# Patient Record
Sex: Male | Born: 1965 | Race: Black or African American | Hispanic: No | Smoking: Current every day smoker
Health system: Southern US, Community
[De-identification: ages and names within clinical notes are randomized; demographics above are authoritative.]

---

## 2016-06-01 ENCOUNTER — Emergency Department (HOSPITAL_COMMUNITY): Payer: Self-pay

## 2016-06-01 ENCOUNTER — Inpatient Hospital Stay (HOSPITAL_COMMUNITY)
Admission: EM | Admit: 2016-06-01 | Discharge: 2016-06-04 | DRG: 378 | Disposition: A | Discharge status: Home / Self Care | Payer: Self-pay | Attending: Internal Medicine | Admitting: Internal Medicine

## 2016-06-01 ENCOUNTER — Encounter (HOSPITAL_COMMUNITY): Payer: Self-pay | Admitting: Emergency Medicine

## 2016-06-01 DIAGNOSIS — K922 Gastrointestinal hemorrhage, unspecified: Secondary | ICD-10-CM | POA: Diagnosis present

## 2016-06-01 DIAGNOSIS — K625 Hemorrhage of anus and rectum: Secondary | ICD-10-CM

## 2016-06-01 DIAGNOSIS — I959 Hypotension, unspecified: Secondary | ICD-10-CM | POA: Diagnosis present

## 2016-06-01 DIAGNOSIS — K254 Chronic or unspecified gastric ulcer with hemorrhage: Principal | ICD-10-CM | POA: Diagnosis present

## 2016-06-01 DIAGNOSIS — E538 Deficiency of other specified B group vitamins: Secondary | ICD-10-CM | POA: Diagnosis present

## 2016-06-01 DIAGNOSIS — K920 Hematemesis: Secondary | ICD-10-CM

## 2016-06-01 DIAGNOSIS — Z7141 Alcohol abuse counseling and surveillance of alcoholic: Secondary | ICD-10-CM

## 2016-06-01 DIAGNOSIS — F1090 Alcohol use, unspecified, uncomplicated: Secondary | ICD-10-CM

## 2016-06-01 DIAGNOSIS — R739 Hyperglycemia, unspecified: Secondary | ICD-10-CM

## 2016-06-01 DIAGNOSIS — K219 Gastro-esophageal reflux disease without esophagitis: Secondary | ICD-10-CM

## 2016-06-01 DIAGNOSIS — F17210 Nicotine dependence, cigarettes, uncomplicated: Secondary | ICD-10-CM | POA: Diagnosis present

## 2016-06-01 DIAGNOSIS — D62 Acute posthemorrhagic anemia: Secondary | ICD-10-CM

## 2016-06-01 DIAGNOSIS — R000 Tachycardia, unspecified: Secondary | ICD-10-CM | POA: Diagnosis present

## 2016-06-01 DIAGNOSIS — K2900 Acute gastritis without bleeding: Secondary | ICD-10-CM | POA: Diagnosis present

## 2016-06-01 DIAGNOSIS — Z716 Tobacco abuse counseling: Secondary | ICD-10-CM

## 2016-06-01 DIAGNOSIS — E876 Hypokalemia: Secondary | ICD-10-CM

## 2016-06-01 DIAGNOSIS — F1010 Alcohol abuse, uncomplicated: Secondary | ICD-10-CM | POA: Diagnosis present

## 2016-06-01 DIAGNOSIS — IMO0002 Reserved for concepts with insufficient information to code with codable children: Secondary | ICD-10-CM

## 2016-06-01 LAB — CBC
HCT: 27 % — ABNORMAL LOW (ref 39.0–52.0)
Hemoglobin: 9.8 g/dL — ABNORMAL LOW (ref 13.0–17.0)
MCH: 35.6 pg — AB (ref 26.0–34.0)
MCHC: 36.3 g/dL — AB (ref 30.0–36.0)
MCV: 98.2 fL (ref 78.0–100.0)
PLATELETS: 306 10*3/uL (ref 150–400)
RBC: 2.75 MIL/uL — ABNORMAL LOW (ref 4.22–5.81)
RDW: 12.1 % (ref 11.5–15.5)
WBC: 8.7 10*3/uL (ref 4.0–10.5)

## 2016-06-01 LAB — COMPREHENSIVE METABOLIC PANEL
ALBUMIN: 3.1 g/dL — AB (ref 3.5–5.0)
ALT: 9 U/L — AB (ref 17–63)
AST: 18 U/L (ref 15–41)
Alkaline Phosphatase: 51 U/L (ref 38–126)
Anion gap: 10 (ref 5–15)
BUN: 33 mg/dL — AB (ref 6–20)
CHLORIDE: 101 mmol/L (ref 101–111)
CO2: 26 mmol/L (ref 22–32)
CREATININE: 1.1 mg/dL (ref 0.61–1.24)
Calcium: 8.5 mg/dL — ABNORMAL LOW (ref 8.9–10.3)
GFR calc Af Amer: >60 mL/min (ref >60)
GLUCOSE: 210 mg/dL — AB (ref 65–99)
Potassium: 3.7 mmol/L (ref 3.5–5.1)
Sodium: 137 mmol/L (ref 135–145)
Total Bilirubin: 0.5 mg/dL (ref 0.3–1.2)
Total Protein: 5.8 g/dL — ABNORMAL LOW (ref 6.5–8.1)

## 2016-06-01 LAB — PREPARE RBC (CROSSMATCH)

## 2016-06-01 LAB — POC OCCULT BLOOD, ED: Fecal Occult Bld: POSITIVE — AB

## 2016-06-01 MED ORDER — SODIUM CHLORIDE 0.9 % IV SOLN
8.0000 mg/h | INTRAVENOUS | Status: DC
  Administered 2016-06-02 – 2016-06-03 (×3): 8 mg/h via INTRAVENOUS
  Filled 2016-06-01 (×8): qty 80

## 2016-06-01 MED ORDER — SODIUM CHLORIDE 0.9 % IV SOLN
80.0000 mg | Freq: Once | INTRAVENOUS | Status: AC
  Administered 2016-06-01: 80 mg via INTRAVENOUS
  Filled 2016-06-01: qty 80

## 2016-06-01 MED ORDER — OCTREOTIDE LOAD VIA INFUSION
50.0000 ug | Freq: Once | INTRAVENOUS | Status: AC
  Administered 2016-06-02: 50 ug via INTRAVENOUS
  Filled 2016-06-01: qty 25

## 2016-06-01 MED ORDER — PANTOPRAZOLE SODIUM 40 MG IV SOLR: 40.0000 mg | Freq: Two times a day (BID) | INTRAVENOUS | Status: DC

## 2016-06-01 MED ORDER — SODIUM CHLORIDE 0.9 % IV SOLN
50.0000 ug/h | INTRAVENOUS | Status: DC
  Administered 2016-06-02 – 2016-06-03 (×4): 50 ug/h via INTRAVENOUS
  Filled 2016-06-01 (×7): qty 1

## 2016-06-01 MED ORDER — SODIUM CHLORIDE 0.9 % IV SOLN
10.0000 mL/h | Freq: Once | INTRAVENOUS | Status: AC
  Administered 2016-06-02: 10 mL/h via INTRAVENOUS

## 2016-06-01 MED ORDER — SODIUM CHLORIDE 0.9 % IV BOLUS (SEPSIS)
1000.0000 mL | Freq: Once | INTRAVENOUS | Status: AC
  Administered 2016-06-02: 1000 mL via INTRAVENOUS

## 2016-06-01 MED ORDER — SODIUM CHLORIDE 0.9 % IV BOLUS (SEPSIS)
1000.0000 mL | Freq: Once | INTRAVENOUS | Status: AC
  Administered 2016-06-01: 1000 mL via INTRAVENOUS

## 2016-06-01 MED ORDER — ONDANSETRON HCL 4 MG/2ML IJ SOLN
4.0000 mg | Freq: Once | INTRAMUSCULAR | Status: AC
  Administered 2016-06-01: 4 mg via INTRAVENOUS
  Filled 2016-06-01: qty 2

## 2016-06-01 NOTE — ED Provider Notes (Signed)
[facility]-EMERGENCY DEPT Provider Note   CSN: [id number] Arrival date & time: 06/01/16  2150   By signing my name below, I, Clarisse Gouge, attest that this documentation has been prepared under the direction and in the presence of Martie Muhlbauer, MD. Electronically signed, Clarisse Gouge, ED Scribe. 06/01/16. 11:47 PM.   History   Chief Complaint Chief Complaint  Patient presents with  . Hematemesis  . Blood In Stools   The history is provided by the patient and medical records. No language interpreter was used.  Emesis   This is a new problem. The current episode started 1 to 2 hours ago. The problem occurs 2 to 4 times per day. The problem has not changed since onset.The emesis has an appearance of bright red blood. There has been no fever. Associated symptoms include abdominal pain and diarrhea. Pertinent negatives include no arthralgias, no fever, no headaches and no myalgias. Risk factors: alcohol abuse.    Derek Fowler is a 51 y.o. male with no pertinent PMHx reported or on file, transported via EMS to the Emergency Department with concern for hematemesis today. He notes associated 10/10 aching epigastric pain per triage, bloody stool and states both emesis and stool had bright red blood. No other modifying factors noted. Pt seen [facility] on Monday for vomiting without blood or diarrhea, imaging and blood work done with normal results, diagnosed with stomache flu and sent home without medications. Pt advised to F/U with PCP at this time. Pt not on any regular medications at home, no chronic disorders reported. No h/o similar symptoms noted. Pr states he smokes 1/2 pack of cigarettes per day and last drank alcohol 3-4 weeks ago. No other complaints at this time.      History reviewed. No pertinent past medical history.  There are no active problems to display for this patient.   History reviewed. No pertinent surgical history.     Home Medications    Prior to Admission  medications   Medication Sig Start Date End Date Taking? Authorizing Provider  ibuprofen (ADVIL,MOTRIN) 200 MG tablet Take 400 mg by mouth every 6 (six) hours as needed for moderate pain.   Yes Historical Provider, MD    Family History No family history on file.  Social History Social History  Substance Use Topics  . Smoking status: Current Every Day Smoker    Packs/day: 0.50    Types: Cigarettes  . Smokeless tobacco: Never Used  . Alcohol use No     Allergies   Patient has no known allergies.   Review of Systems Review of Systems  Constitutional: Negative for fever.  Respiratory: Negative for shortness of breath.   Cardiovascular: Negative for chest pain.  Gastrointestinal: Positive for abdominal pain, blood in stool, diarrhea, nausea and vomiting (bloody).  Genitourinary: Negative for dysuria.  Musculoskeletal: Negative for arthralgias and myalgias.  Neurological: Negative for headaches.  All other systems reviewed and are negative.    Physical Exam Updated Vital Signs BP 93/75 (BP Location: Left Arm)   Pulse (!) 121   Temp 97.8 F (36.6 C) (Rectal)   Resp (!) 23   SpO2 100%   Physical Exam  Constitutional: He is oriented to person, place, and time. He appears well-developed and well-nourished.  HENT:  Head: Normocephalic and atraumatic.  Mouth/Throat: Oropharynx is clear and moist. No oropharyngeal exudate.  Blood crusted around the mouth  Eyes: EOM are normal. Pupils are equal, round, and reactive to light.  Neck: Normal range of motion.  Neck supple. No JVD present.  Cardiovascular: Regular rhythm, normal heart sounds and intact distal pulses.  Tachycardia present.   Pulmonary/Chest: Effort normal and breath sounds normal. No stridor. He has no wheezes. He has no rales.  No stridor  Abdominal: Soft. Bowel sounds are normal. He exhibits no mass. There is no tenderness. There is no rebound and no guarding.  Genitourinary: Rectal exam shows guaiac positive  stool.  Musculoskeletal: Normal range of motion. He exhibits no edema.  No bruising noted  Neurological: He is alert and oriented to person, place, and time. He displays normal reflexes.  Skin: Skin is warm and dry. Capillary refill takes less than 2 seconds. He is not diaphoretic.  Psychiatric: He has a normal mood and affect.  Nursing note and vitals reviewed.  No stridor bruise intact distal intact dtr soft abdomen PERRL moist muc no exud  ED Treatments / Results   Vitals:   06/02/16 0000 06/02/16 0015  BP: 102/65 (!) 101/53  Pulse: 78 90  Resp: 13 (!) 23  Temp:      DIAGNOSTIC STUDIES: Oxygen Saturation is 100% on RA, NL by my interpretation.    COORDINATION OF CARE: 11:23 PM-Discussed next steps with pt. Pt verbalized understanding and is agreeable with the plan. Will order labs and medications.   Labs (all labs ordered are listed, but only abnormal results are displayed)  Results for orders placed or performed during the hospital encounter of 06/01/16  Comprehensive metabolic panel  Result Value Ref Range   Sodium 137 135 - 145 mmol/L   Potassium 3.7 3.5 - 5.1 mmol/L   Chloride 101 101 - 111 mmol/L   CO2 26 22 - 32 mmol/L   Glucose, Bld 210 (H) 65 - 99 mg/dL   BUN 33 (H) 6 - 20 mg/dL   Creatinine, Ser 1.61 0.61 - 1.24 mg/dL   Calcium 8.5 (L) 8.9 - 10.3 mg/dL   Total Protein 5.8 (L) 6.5 - 8.1 g/dL   Albumin 3.1 (L) 3.5 - 5.0 g/dL   AST 18 15 - 41 U/L   ALT 9 (L) 17 - 63 U/L   Alkaline Phosphatase 51 38 - 126 U/L   Total Bilirubin 0.5 0.3 - 1.2 mg/dL   GFR calc non Af Amer >60 >60 mL/min   GFR calc Af Amer >60 >60 mL/min   Anion gap 10 5 - 15  CBC  Result Value Ref Range   WBC 8.7 4.0 - 10.5 K/uL   RBC 2.75 (L) 4.22 - 5.81 MIL/uL   Hemoglobin 9.8 (L) 13.0 - 17.0 g/dL   HCT 09.6 (L) 04.5 - 40.9 %   MCV 98.2 78.0 - 100.0 fL   MCH 35.6 (H) 26.0 - 34.0 pg   MCHC 36.3 (H) 30.0 - 36.0 g/dL   RDW 81.1 91.4 - 78.2 %   Platelets 306 150 - 400 K/uL  POC occult  blood, ED  Result Value Ref Range   Fecal Occult Bld POSITIVE (A) NEGATIVE  Type and screen [facility]  Result Value Ref Range   ABO/RH(D) O POS    Antibody Screen NEG    Sample Expiration 06/04/2016    Unit Number [id number]    Blood Component Type RED CELLS,LR    Unit division 00    Status of Unit ISSUED    Transfusion Status OK TO TRANSFUSE    Crossmatch Result Compatible    Unit Number [id number]    Blood Component Type RED CELLS,LR  Unit division 00    Status of Unit ALLOCATED    Transfusion Status OK TO TRANSFUSE    Crossmatch Result Compatible    Unit Number [id number]    Blood Component Type RED CELLS,LR    Unit division 00    Status of Unit ALLOCATED    Transfusion Status OK TO TRANSFUSE    Crossmatch Result Compatible    Unit Number [id number]    Blood Component Type RED CELLS,LR    Unit division 00    Status of Unit ALLOCATED    Transfusion Status OK TO TRANSFUSE    Crossmatch Result Compatible   ABO/Rh  Result Value Ref Range   ABO/RH(D) O POS   Prepare RBC  Result Value Ref Range   Order Confirmation ORDER PROCESSED BY BLOOD BANK   BPAM RBC  Result Value Ref Range   ISSUE DATE / TIME [id number]    Blood Product Unit Number [id number]    PRODUCT CODE [id number]    Unit Type and Rh 5100    Blood Product Expiration Date 272536644034    Blood Product Unit Number [id number]    Unit Type and Rh 5100    Blood Product Expiration Date 332951884166    Blood Product Unit Number [id number]    Unit Type and Rh 5100    Blood Product Expiration Date 557322025427    Blood Product Unit Number [id number]    Unit Type and Rh 5100    Blood Product Expiration Date 616073710626    No results found.   Procedures Procedures (including critical care time)  Medications Ordered in ED Medications  pantoprazole (PROTONIX) 80 mg in sodium chloride 0.9 % 250 mL (0.32 mg/mL) infusion (8 mg/hr Intravenous New Bag/Given 06/02/16  0006)  pantoprazole (PROTONIX) injection 40 mg (not administered)  0.9 %  sodium chloride infusion (not administered)  octreotide (SANDOSTATIN) 2 mcg/mL load via infusion 50 mcg (not administered)    And  octreotide (SANDOSTATIN) 500 mcg in sodium chloride 0.9 % 250 mL (2 mcg/mL) infusion (not administered)  sodium chloride 0.9 % bolus 1,000 mL (0 mLs Intravenous Stopped 06/02/16 0005)  pantoprazole (PROTONIX) 80 mg in sodium chloride 0.9 % 100 mL IVPB (0 mg Intravenous Stopped 06/02/16 0005)  ondansetron (ZOFRAN) injection 4 mg (4 mg Intravenous Given 06/01/16 2330)  sodium chloride 0.9 % bolus 1,000 mL (1,000 mLs Intravenous New Bag/Given 06/02/16 0008)   Blood prepared for transfusion.  Initial Impression / Assessment and Plan / ED Course  I have reviewed the triage vital signs and the nursing notes.  Pertinent labs & imaging results that were available during my care of the patient were reviewed by me and considered in my medical decision making (see chart for details).     MDM Reviewed: previous chart, nursing note and vitals Reviewed previous: labs and CT scan Interpretation: labs (HB 9.8 which is anemic and has elevated glucose and BUN) Total time providing critical care: 75-105 minutes. This excludes time spent performing separately reportable procedures and services. Consults: critical care and gastrointestinal   11:53 PM Pt discussed with Dr. Arsenio Loader of critical care, a member of the team will see the patient.  12:07 AM Pt discussed with Dr. Dulce Sellar of GI who has been consulted.  CRITICAL CARE Performed by: Jasmine Awe Total critical care time: 90 minutes Critical care time was exclusive of separately billable procedures and treating other patients. Critical care was necessary to treat or prevent imminent or life-threatening deterioration. Critical care was time spent personally by me  on the following activities: development of treatment plan with patient and/or  surrogate as well as nursing, discussions with consultants, evaluation of patient's response to treatment, examination of patient, obtaining history from patient or surrogate, ordering and performing treatments and interventions, ordering and review of laboratory studies, ordering and review of radiographic studies, pulse oximetry and re-evaluation of patient's condition.   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     I personally performed the services described in this documentation, which was scribed in my presence. The recorded information has been reviewed and is accurate.       Cy Blamer, MD 06/02/16 [phone]

## 2016-06-01 NOTE — ED Triage Notes (Signed)
Pt comes from home with complaints of vomiting blood and blood in his stools since this evening.  Endorses epigastric pain in the past 2 weeks. A&O x4. Ambulatory with assistance.  Vitals 115/91 BP, 120 HR, 16 RR in route. CBG 113.

## 2016-06-01 NOTE — ED Notes (Signed)
Bed: ZO10 Expected date:  Expected time:  Means of arrival:  Comments: 51 yo M  GI bleed

## 2016-06-02 ENCOUNTER — Emergency Department (HOSPITAL_COMMUNITY): Payer: Self-pay

## 2016-06-02 ENCOUNTER — Encounter (HOSPITAL_COMMUNITY): Payer: Self-pay | Admitting: Emergency Medicine

## 2016-06-02 ENCOUNTER — Encounter (HOSPITAL_COMMUNITY)
Admission: EM | Disposition: A | Discharge status: Home / Self Care | Payer: Self-pay | Source: Home / Self Care | Attending: Internal Medicine

## 2016-06-02 DIAGNOSIS — K922 Gastrointestinal hemorrhage, unspecified: Secondary | ICD-10-CM

## 2016-06-02 HISTORY — PX: ESOPHAGOGASTRODUODENOSCOPY: SHX5428

## 2016-06-02 LAB — BASIC METABOLIC PANEL
ANION GAP: 7 (ref 5–15)
BUN: 28 mg/dL — ABNORMAL HIGH (ref 6–20)
CALCIUM: 7.4 mg/dL — AB (ref 8.9–10.3)
CO2: 24 mmol/L (ref 22–32)
CREATININE: 0.76 mg/dL (ref 0.61–1.24)
Chloride: 107 mmol/L (ref 101–111)
Glucose, Bld: 119 mg/dL — ABNORMAL HIGH (ref 65–99)
Potassium: 4.5 mmol/L (ref 3.5–5.1)
SODIUM: 138 mmol/L (ref 135–145)

## 2016-06-02 LAB — CBC
HCT: 26.7 % — ABNORMAL LOW (ref 39.0–52.0)
HCT: 24 % — ABNORMAL LOW (ref 39.0–52.0)
HEMATOCRIT: 25.2 % — AB (ref 39.0–52.0)
HEMATOCRIT: 25.1 % — AB (ref 39.0–52.0)
HEMOGLOBIN: 8.4 g/dL — AB (ref 13.0–17.0)
Hemoglobin: 8.8 g/dL — ABNORMAL LOW (ref 13.0–17.0)
Hemoglobin: 9.5 g/dL — ABNORMAL LOW (ref 13.0–17.0)
Hemoglobin: 8.7 g/dL — ABNORMAL LOW (ref 13.0–17.0)
MCH: 32.8 pg (ref 26.0–34.0)
MCH: 33.9 pg (ref 26.0–34.0)
MCH: 32.5 pg (ref 26.0–34.0)
MCH: 32.9 pg (ref 26.0–34.0)
MCHC: 34.9 g/dL (ref 30.0–36.0)
MCHC: 35.6 g/dL (ref 30.0–36.0)
MCHC: 34.7 g/dL (ref 30.0–36.0)
MCHC: 35 g/dL (ref 30.0–36.0)
MCV: 94 fL (ref 78.0–100.0)
MCV: 95.4 fL (ref 78.0–100.0)
MCV: 93.7 fL (ref 78.0–100.0)
MCV: 94.1 fL (ref 78.0–100.0)
PLATELETS: 182 10*3/uL (ref 150–400)
PLATELETS: 169 10*3/uL (ref 150–400)
Platelets: 207 10*3/uL (ref 150–400)
Platelets: 167 10*3/uL (ref 150–400)
RBC: 2.68 MIL/uL — ABNORMAL LOW (ref 4.22–5.81)
RBC: 2.8 MIL/uL — AB (ref 4.22–5.81)
RBC: 2.68 MIL/uL — ABNORMAL LOW (ref 4.22–5.81)
RBC: 2.55 MIL/uL — AB (ref 4.22–5.81)
RDW: 12.4 % (ref 11.5–15.5)
RDW: 13.4 % (ref 11.5–15.5)
RDW: 13.7 % (ref 11.5–15.5)
RDW: 13.8 % (ref 11.5–15.5)
WBC: 8.7 10*3/uL (ref 4.0–10.5)
WBC: 8 10*3/uL (ref 4.0–10.5)
WBC: 6.7 10*3/uL (ref 4.0–10.5)
WBC: 7 10*3/uL (ref 4.0–10.5)

## 2016-06-02 LAB — ABO/RH: ABO/RH(D): O POS

## 2016-06-02 LAB — PROTIME-INR
INR: 1.07
PROTHROMBIN TIME: 14 s (ref 11.4–15.2)

## 2016-06-02 LAB — MRSA PCR SCREENING: MRSA BY PCR: NEGATIVE

## 2016-06-02 LAB — PHOSPHORUS: PHOSPHORUS: 3.5 mg/dL (ref 2.5–4.6)

## 2016-06-02 LAB — MAGNESIUM: MAGNESIUM: 1.8 mg/dL (ref 1.7–2.4)

## 2016-06-02 SURGERY — EGD (ESOPHAGOGASTRODUODENOSCOPY)
Anesthesia: Moderate Sedation

## 2016-06-02 MED ORDER — FENTANYL CITRATE (PF) 100 MCG/2ML IJ SOLN
INTRAMUSCULAR | Status: AC
  Filled 2016-06-02: qty 4

## 2016-06-02 MED ORDER — MIDAZOLAM HCL 5 MG/ML IJ SOLN
INTRAMUSCULAR | Status: AC
  Filled 2016-06-02: qty 2

## 2016-06-02 MED ORDER — SODIUM CHLORIDE 0.9 % IV BOLUS (SEPSIS)
1000.0000 mL | Freq: Once | INTRAVENOUS | Status: AC
  Administered 2016-06-02: 1000 mL via INTRAVENOUS

## 2016-06-02 MED ORDER — SODIUM CHLORIDE 0.9 % IV SOLN
INTRAVENOUS | Status: DC
  Administered 2016-06-02 – 2016-06-04 (×4): via INTRAVENOUS

## 2016-06-02 MED ORDER — FENTANYL CITRATE (PF) 100 MCG/2ML IJ SOLN
INTRAMUSCULAR | Status: DC | PRN
  Administered 2016-06-02 (×3): 25 ug via INTRAVENOUS

## 2016-06-02 MED ORDER — MIDAZOLAM HCL 10 MG/2ML IJ SOLN
INTRAMUSCULAR | Status: DC | PRN
  Administered 2016-06-02 (×3): 2 mg via INTRAVENOUS

## 2016-06-02 MED ORDER — BUTAMBEN-TETRACAINE-BENZOCAINE 2-2-14 % EX AERO
INHALATION_SPRAY | CUTANEOUS | Status: DC | PRN
  Administered 2016-06-02: 2 via TOPICAL

## 2016-06-02 NOTE — Progress Notes (Signed)
BP improved with NS, pt hemodynamically stable.  EGD results noted > deep cratered antral ulcer with adherent clot s/p biopsy.  If re-bleeding, would need IR for embolization vs surgery.  GI rec's for continued NPO & assess H.Pylori serology.    PCCM will transfer patient to SDU status and TRH for am 4/21.  Canary Brim, NP-C [facility] Pgr: [phone] or if no answer [phone] 06/02/2016, 2:10 PM

## 2016-06-02 NOTE — Interval H&P Note (Signed)
History and Physical Interval Note:  06/02/2016 11:46 AM  Derek Fowler  has presented today for surgery, with the diagnosis of GI bleed  The various methods of treatment have been discussed with the patient and family. After consideration of risks, benefits and other options for treatment, the patient has consented to  Procedure(s): ESOPHAGOGASTRODUODENOSCOPY (EGD) (N/A) as a surgical intervention .  The patient's history has been reviewed, patient examined, no change in status, stable for surgery.  I have reviewed the patient's chart and labs.  Questions were answered to the patient's satisfaction.     [name] C.

## 2016-06-02 NOTE — Op Note (Signed)
[facility] Patient Name: Derek Fowler Procedure Date: 06/02/2016 MRN: [id number] Attending MD: Shirley Friar , MD Date of Birth: August 01, 1965 CSN: [id number] Age: 51 Admit Type: Inpatient Procedure:                Upper GI endoscopy Indications:              Suspected upper gastrointestinal bleeding,                            Hematemesis, Melena Providers:                Shirley Friar, MD, Cathlean Marseilles, RN, Kandice Robinsons, Technician Referring MD:              Medicines:                Fentanyl 75 micrograms IV, Midazolam 6 mg IV,                            Cetacaine spray Complications:            No immediate complications. Estimated Blood Loss:     Estimated blood loss was minimal. Procedure:                Pre-Anesthesia Assessment:                           - Prior to the procedure, a History and Physical                            was performed, and patient medications and                            allergies were reviewed. The patient's tolerance of                            previous anesthesia was also reviewed. The risks                            and benefits of the procedure and the sedation                            options and risks were discussed with the patient.                            All questions were answered, and informed consent                            was obtained. Prior Anticoagulants: The patient has                            taken no previous anticoagulant or antiplatelet                            agents. ASA Grade  Assessment: III - A patient with                            severe systemic disease. After reviewing the risks                            and benefits, the patient was deemed in                            satisfactory condition to undergo the procedure.                           After obtaining informed consent, the endoscope was                            passed under direct vision.  Throughout the                            procedure, the patient's blood pressure, pulse, and                            oxygen saturations were monitored continuously. The                            [id number]) scope was introduced through the                            mouth, and advanced to the second part of duodenum.                            The upper GI endoscopy was accomplished without                            difficulty. The patient tolerated the procedure                            well. Scope In: Scope Out: Findings:      The examined esophagus was normal.      The Z-line was regular and was found 42 cm from the incisors.      One non-bleeding cratered gastric ulcer with adherent clot was found in       the gastric antrum. The lesion was 14 mm in largest dimension.      Segmental severe inflammation characterized by congestion (edema) and       erythema was found in the gastric antrum. Biopsies were taken with a       cold forceps for histology. Estimated blood loss was minimal.      The cardia and gastric fundus were normal on retroflexion.      The examined duodenum was normal. Impression:               - Normal esophagus.                           - Z-line regular, 42 cm from the incisors.                           -  Non-bleeding gastric ulcer with adherent clot.                           - Acute gastritis. Biopsied.                           - Normal examined duodenum. Moderate Sedation:      Moderate (conscious) sedation was administered by the endoscopy nurse       and supervised by the endoscopist. The following parameters were       monitored: oxygen saturation, heart rate, blood pressure, and response       to care. Recommendation:           - NPO.                           - Await pathology results.                           - If rebleeding occurs, then will need embolization                            by Interventional Radiology and if that is not                             successful, will need surgery.                           - Perform an H. pylori serology.                           - Post procedure medication orders were given.                           - Give Protonix (pantoprazole): 8 mg/hr IV by                            continuous infusion. Procedure Code(s):        --- Professional ---                           [phone], Esophagogastroduodenoscopy, flexible,                            transoral; with biopsy, single or multiple Diagnosis Code(s):        --- Professional ---                           K92.0, Hematemesis                           K92.1, Melena (includes Hematochezia)                           K25.4, Chronic or unspecified gastric ulcer with                            hemorrhage  K29.00, Acute gastritis without bleeding CPT copyright 2016 American Medical Association. All rights reserved. The codes documented in this report are preliminary and upon coder review may  be revised to meet current compliance requirements. Shirley Friar, MD 06/02/2016 1:12:05 PM This report has been signed electronically. Number of Addenda: 0

## 2016-06-02 NOTE — Brief Op Note (Addendum)
Deep cratered antral ulcer with adherent clot. Edematous mucosa surrounding ulcer likely reactive. Biopsies of mucosa taken. If rebleeding occurs, then needs IR for embolization and if that is not successful or possible then surgery. Check H. Pylori serology and treat if positive. F/U on path. Continue Protonix drip. NPO. Supportive care. [facility] will f/u.

## 2016-06-02 NOTE — ED Notes (Signed)
X-ray at bedside

## 2016-06-02 NOTE — Progress Notes (Signed)
S:  Pt reports feeling much better. Denies pain / dizziness, n/v/d.  Denies further blood     O: Blood pressure 106/64, pulse 69, temperature 97.7 F (36.5 C), temperature source Oral, resp. rate 14, height  (1.753 m), weight 133 lb 2.5 oz (60.4 kg), SpO2 100 %.   General: thin adult male in NAD HEENT: MM pink/moist PSY: calm/appropriate Neuro: AAOx4, speech clear, MAE  CV: s1s2 rrr, no m/r/g PULM: even/non-labored, lungs bilaterally clear  WU:JWJX, non-tender, bsx4 active  Extremities: warm/dry, no edema  Skin: no rashes or lesions    Recent Labs Lab 06/01/16 2200 06/02/16 0327  HGB 9.8* 8.8*  HCT 27.0* 25.2*  WBC 8.7 8.7  PLT 306 207    Recent Labs Lab 06/01/16 2200 06/02/16 0327  NA 137 138  K 3.7 4.5  CL 101 107  CO2 26 24  GLUCOSE 210* 119*  BUN 33* 28*  CREATININE 1.10 0.76  CALCIUM 8.5* 7.4*  MG  --  1.8  PHOS  --  3.5     A:  Hematemesis  Hematochezia / Melena Hypotension - resolved with IVF Acute Blood Loss Anemia  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 Nicanor Alcon spoke with Dr. Dulce Sellar according to documentation) Keep in ICU until seen by GI  Continue octreotide / protonix gtt's for now Monitor for evidence of withdrawal Follow up labs in am  Continue plan of care outlined in H&P   Canary Brim, NP-C [facility] Pgr: [phone] or if no answer [phone] 06/02/2016, 10:44 AM

## 2016-06-02 NOTE — H&P (View-Only) (Signed)
Referring Provider: Dr. Byrum Primary Care Physician:  No PCP Per Patient Primary Gastroenterologist:  UNASSIGNED  Reason for Consultation:  Hematemesis  HPI: Derek Fowler is a 50 y.o. male with a history of alcohol abuse without known cirrhosis presenting with hematemesis at home several times that was mostly black but had some small red color to it per his report. He then had episodes of black stools. Had dizziness and he is not sure if he passed out. Felt bloated but denies abdominal pain. Denies hematochezia. No bleeding since admit. Hgb 9.8 on admit. Hypotensive in the ER in the 70's systolic that responded to volume resuscitation. Denies NSAIDs. Reports last alcohol 3-4 weeks ago. No known history of ulcers. Was in ER this earlier this week with nonbloody vomitus that was thought to be gastroenteritis.  History reviewed. No pertinent past medical history.  History reviewed. No pertinent surgical history.  Prior to Admission medications   Medication Sig Start Date End Date Taking? Authorizing Provider  ibuprofen (ADVIL,MOTRIN) 200 MG tablet Take 400 mg by mouth every 6 (six) hours as needed for moderate pain.   Yes Historical Provider, MD    Scheduled Meds: . [MAR Hold] pantoprazole  40 mg Intravenous Q12H   Continuous Infusions: . sodium chloride    . octreotide  (SANDOSTATIN)    IV infusion 50 mcg/hr (06/02/16 0944)  . pantoprozole (PROTONIX) infusion 8 mg/hr (06/02/16 0944)   PRN Meds:.  Allergies as of 06/01/2016  . (No Known Allergies)    No family history on file.  Social History   Social History  . Marital status: Legally Separated    Spouse name: N/A  . Number of children: N/A  . Years of education: N/A   Occupational History  . Not on file.   Social History Main Topics  . Smoking status: Current Every Day Smoker    Packs/day: 0.50    Types: Cigarettes  . Smokeless tobacco: Never Used  . Alcohol use No  . Drug use: No  . Sexual activity: Not on file    Other Topics Concern  . Not on file   Social History Narrative  . No narrative on file    Review of Systems: All negative except as stated above in HPI.  Physical Exam: Vital signs: Vitals:   06/02/16 0900 06/02/16 1000  BP: (!) 109/59 106/64  Pulse: 68 69  Resp: 19 14  Temp:    T 97.7  Last BM Date: 06/01/16 General:   Lethargic, thin, no acute distress HEENT: anicteric sclera, poor dentition Neck: supple, nontender Lungs:  Clear throughout to auscultation.   No wheezes, crackles, or rhonchi. No acute distress. Heart:  Regular rate and rhythm; no murmurs, clicks, rubs,  or gallops. Abdomen: soft, nontender, nondistended, +BS Rectal:  Deferred Ext: no edema  GI:  Lab Results:  Recent Labs  06/01/16 2200 06/02/16 0327 06/02/16 1102  WBC 8.7 8.7 8.0  HGB 9.8* 8.8* 9.5*  HCT 27.0* 25.2* 26.7*  PLT 306 207 182   BMET  Recent Labs  06/01/16 2200 06/02/16 0327  NA 137 138  K 3.7 4.5  CL 101 107  CO2 26 24  GLUCOSE 210* 119*  BUN 33* 28*  CREATININE 1.10 0.76  CALCIUM 8.5* 7.4*   LFT  Recent Labs  06/01/16 2200  PROT 5.8*  ALBUMIN 3.1*  AST 18  ALT 9*  ALKPHOS 51  BILITOT 0.5   PT/INR  Recent Labs  06/02/16 0053  LABPROT 14.0  INR 1.07       Studies/Results: Dg Abd Acute W/chest  Result Date: 06/02/2016 CLINICAL DATA:  Patient is been vomiting for over 1 week. Seen at [facility] a couple of days ago and did not find anything wrong. Now began vomiting blood today. Increasing weakness. Mid abdominal pain. Patient is unstable and receiving blood transfusions. EXAM: DG ABDOMEN ACUTE W/ 1V CHEST COMPARISON:  CT abdomen pelvis 05/29/2016.  Chest 05/29/2016. FINDINGS: Normal heart size and pulmonary vascularity. No focal airspace disease or consolidation in the lungs. No blunting of costophrenic angles. No pneumothorax. Mediastinal contours appear intact. Scattered gas and stool in the colon. No small or large bowel distention. No free  intra-abdominal air. No abnormal air-fluid levels. No radiopaque stones. Visualized bones appear intact. IMPRESSION: No evidence of active pulmonary disease. Normal nonobstructive bowel gas pattern. No free air. Electronically Signed   By: Burman Nieves M.D.   On: 06/02/2016 01:00    Impression/Plan: 51 yo with melena and hematemesis concerning for a peptic ulcer bleed. Mallory Weiss tear also possible. Gastritis less likely to be source of bleeding. Doubt variceal bleed. Bedside EGD. Supportive care. Protonix drip. Octreotide drip.    LOS: 0 days   Sourish Allender C.  06/02/2016, 11:36 AM  Pager [phone]  AFTER 5 pm or on weekends please call [phone]

## 2016-06-02 NOTE — H&P (Signed)
PULMONARY / CRITICAL CARE MEDICINE   Name: Derek Fowler MRN: [id number] DOB: April 21, 1965    ADMISSION DATE:  06/01/2016 CONSULTATION DATE:  06/02/2016  REFERRING MD:  Dr. Nicanor Alcon EDP  CHIEF COMPLAINT:  Hematemesis   HISTORY OF PRESENT ILLNESS:  51 year old male with PMH significant for alcohol abuse (2-3 24oz beer per day for "years", no drinks for past month), pancreatitis, and tobacco abuse. His initial complaint was abdominal pain, which started about 2 weeks prior to presentation. Approximately one week after that, the pain became associated with vomiting. Emesis was not bloody at that time. 4/16 he presented to [facility] with these complaints and had a CT scan consistent with gastroenteritis. This was reportedly felt to be viral and he was discharged. Then 4/19 while seated on his brothers couch he had and episode of massive hematemesis of bright red blood and subsequently a bowel movement resembling bright red blood. He was transported to ED by EMS, where he was found to be mildly hypotensive, but responded to fluids. Laboratory eval notable for hemoglobin 9.8, down from 16 a few days prior. GI consulted and blood ordered by EDP. PCCM to admit. He denies chronic NSAID use.   Of note in February of 2014 he was seen in the ED in [facility] for epigastric pain. CT revealed a localized area of gastric wall thickening concerning for gastric wall lesion. He was to follow up with GI, but did not.    PAST MEDICAL HISTORY :  He  has no past medical history on file.  PAST SURGICAL HISTORY: He  has no past surgical history on file.  No Known Allergies  No current facility-administered medications on file prior to encounter.    No current outpatient prescriptions on file prior to encounter.    FAMILY HISTORY:  His has no family status information on file.    SOCIAL HISTORY: He  reports that he has been smoking Cigarettes.  He has been smoking about 0.50 packs per day. He has  never used smokeless tobacco. He reports that he does not drink alcohol or use drugs.  REVIEW OF SYSTEMS:   Bolds are positive  Constitutional: weight loss, gain, night sweats, Fevers, chills, fatigue .  HEENT: headaches, Sore throat, sneezing, nasal congestion, post nasal drip, Difficulty swallowing, Tooth/dental problems, visual complaints visual changes, ear ache CV:  chest pain, radiates:,Orthopnea, PND, swelling in lower extremities, dizziness, palpitations, syncope.  GI  heartburn, indigestion, abdominal pain, nausea, vomiting, diarrhea, change in bowel habits, loss of appetite, bloody stools.  Resp: cough, productive:, hemoptysis, dyspnea, chest pain, pleuritic.  Skin: rash or itching or icterus GU: dysuria, change in color of urine, urgency or frequency. flank pain, hematuria  MS: joint pain or swelling. decreased range of motion  Psych: change in mood or affect. depression or anxiety.  Neuro: difficulty with speech, weakness, numbness, ataxia    SUBJECTIVE:    VITAL SIGNS: BP (!) 101/53   Pulse 90   Temp 97.8 F (36.6 C) (Rectal)   Resp (!) 23   SpO2 100%   HEMODYNAMICS:    VENTILATOR SETTINGS:    INTAKE / OUTPUT: No intake/output data recorded.  PHYSICAL EXAMINATION: General:  Thin middle aged male in NAD Neuro:  Alert, oriented, non-focal HEENT:  [place]/AT, PERRL, no JVD Cardiovascular:  RRR, no MRG Lungs:  Clear Abdomen:  Soft, non-tender, non-distended. BS hyperactive Musculoskeletal:  No acute deformity or ROM limitation Skin:  Grossly intact  LABS:  BMET  Recent  Labs Lab 06/01/16 2200  NA 137  K 3.7  CL 101  CO2 26  BUN 33*  CREATININE 1.10  GLUCOSE 210*    Electrolytes  Recent Labs Lab 06/01/16 2200  CALCIUM 8.5*    CBC  Recent Labs Lab 06/01/16 2200  WBC 8.7  HGB 9.8*  HCT 27.0*  PLT 306    Coag's No results for input(s): APTT, INR in the last 168 hours.  Sepsis Markers No results for input(s): LATICACIDVEN,  PROCALCITON, O2SATVEN in the last 168 hours.  ABG No results for input(s): PHART, PCO2ART, PO2ART in the last 168 hours.  Liver Enzymes  Recent Labs Lab 06/01/16 2200  AST 18  ALT 9*  ALKPHOS 51  BILITOT 0.5  ALBUMIN 3.1*    Cardiac Enzymes No results for input(s): TROPONINI, PROBNP in the last 168 hours.  Glucose No results for input(s): GLUCAP in the last 168 hours.  Imaging No results found.   STUDIES:  CT AP 4/16 > Normal appendix. Fluid-filled loops small bowel and a small amount of pelvic free fluid as can be seen with gastroenteritis  CULTURES:   ANTIBIOTICS: None  SIGNIFICANT EVENTS:   LINES/TUBES: PIV x 4   ASSESSMENT / PLAN:  Gastrointestinal bleeding: Likely upper source based on symptoms (hematemesis, hematochezia) and history of ETOH abuse. - Telemetry monitoring in ICU, high risk for further episodes - Continue transfusion of 2 units PRBC as per ED order - GI to see tonight per EDP consult - Protonix infusion - Octreotide infusion - NPO  Hypotension: secondary to Acute blood loss anemia in setting GI hemorrhage, improved s/p 2L IVF in ED - Continue with transfusion - Vital signs per ICU protocol - Low threshold for pressors if needed - Serial CBC - Assess INR  ETOH abuse: No drinks in past 3-4 weeks so should be outside window for withdrawal - Close monitoring  Hyperglycemia without history of DM - Monitor glucose after hydration  Joneen Roach, AGACNP-BC [facility] Pager [phone] or [phone]  06/02/2016 12:48 AM   Attending Note:  I have examined patient, reviewed labs, studies and notes. I have discussed the case with Henreitta Leber, and I agree with the data and plans as amended above.   51 yo man with hx former heavy EtOH, tobacco use, pancreatitis. He was seen urgently at [facility] after an episode large volume hematemesis and then BRBPR on 4/19 pm. In retrospect he noted some mid-epigastric pain for  about 2 weeks. Then developed emesis about 1 week ago (non-bloody). Prompted eval - CT abd 4/16 suggestive of gastroenteritis. At [facility] ED his Hgb was significantly below baseline at 9.8. 2 units PRBC ordered in setting apparent active bleeding. He has not experienced any more blood so far. Hemodynamics stable except for some mild tachycardia. On my eval PRBC are running. He wakes easily and interacts appropriately. Comfortable resp pattern with clear lungs. Heart regular, no m. Abdomen without tenderness to palp, + BS. No edema. Suspect upper GI source - ? Gastritis / PUD, ? M-W tear from his emesis, ? Esophageal varices. GI has been consulted, eval pending. We will continue PPI gtt and octreotide pending EGD inspection. Follow hemodynamics and support as needed - he has responded well to IVF and blood. Follow serial CBC. He has a hx EtOH but nothing for ~3-4 weeks. Suspect that withdrawal not an issue - will monitor.   Independent critical care time is 40 minutes.   Levy Pupa, MD, PhD 06/02/2016, 5:44 AM [facility] [phone] or if no answer [phone]

## 2016-06-02 NOTE — Progress Notes (Signed)
S:  Called by RN regarding hypotension post EGD.  No bleeding.     O: Blood pressure (!) 82/37, pulse 75, temperature 97.7 F (36.5 C), temperature source Oral, resp. rate 16, height  (1.753 m), weight 133 lb 2.5 oz (60.4 kg), SpO2 100 %.   A:  Hypotension - suspect related to sedation for procedure  P:  1L NS bolus now x1  Follow up BP    Canary Brim, NP-C [facility] Pgr: [phone] or if no answer [phone] 06/02/2016, 1:11 PM

## 2016-06-02 NOTE — Care Management Note (Signed)
Case Management Note  Patient Details  Name: TINA TEMME MRN: [id number] Date of Birth: 1965-08-23  Subjective/Objective:       Lower gi bleed             Action/Plan: Date:  June 02, 2016 Chart reviewed for concurrent status and case management needs. Will continue to follow patient progress. Discharge Planning: following for needs Expected discharge date: [id number] Marcelle Smiling, BSN, [place], [place]   [phone]  Expected Discharge Date:   (unknown)               Expected Discharge Plan:  Home/Self Care  In-House Referral:     Discharge planning Services     Post Acute Care Choice:    Choice offered to:     DME Arranged:    DME Agency:     HH Arranged:    HH Agency:     Status of Service:  In process, will continue to follow  If discussed at Long Length of Stay Meetings, dates discussed:    Additional Comments:  Golda Acre, RN 06/02/2016, 11:05 AM

## 2016-06-02 NOTE — Consult Note (Signed)
Referring Provider: Dr. Delton Coombes Primary Care Physician:  No PCP Per Patient Primary Gastroenterologist:  UNASSIGNED  Reason for Consultation:  Hematemesis  HPI: Derek Fowler is a 51 y.o. male with a history of alcohol abuse without known cirrhosis presenting with hematemesis at home several times that was mostly black but had some small red color to it per his report. He then had episodes of black stools. Had dizziness and he is not sure if he passed out. Felt bloated but denies abdominal pain. Denies hematochezia. No bleeding since admit. Hgb 9.8 on admit. Hypotensive in the ER in the 70's systolic that responded to volume resuscitation. Denies NSAIDs. Reports last alcohol 3-4 weeks ago. No known history of ulcers. Was in ER this earlier this week with nonbloody vomitus that was thought to be gastroenteritis.  History reviewed. No pertinent past medical history.  History reviewed. No pertinent surgical history.  Prior to Admission medications   Medication Sig Start Date End Date Taking? Authorizing Provider  ibuprofen (ADVIL,MOTRIN) 200 MG tablet Take 400 mg by mouth every 6 (six) hours as needed for moderate pain.   Yes Historical Provider, MD    Scheduled Meds: . [MAR Hold] pantoprazole  40 mg Intravenous Q12H   Continuous Infusions: . sodium chloride    . octreotide  (SANDOSTATIN)    IV infusion 50 mcg/hr (06/02/16 0944)  . pantoprozole (PROTONIX) infusion 8 mg/hr (06/02/16 0944)   PRN Meds:.  Allergies as of 06/01/2016  . (No Known Allergies)    No family history on file.  Social History   Social History  . Marital status: Legally Separated    Spouse name: N/A  . Number of children: N/A  . Years of education: N/A   Occupational History  . Not on file.   Social History Main Topics  . Smoking status: Current Every Day Smoker    Packs/day: 0.50    Types: Cigarettes  . Smokeless tobacco: Never Used  . Alcohol use No  . Drug use: No  . Sexual activity: Not on file    Other Topics Concern  . Not on file   Social History Narrative  . No narrative on file    Review of Systems: All negative except as stated above in HPI.  Physical Exam: Vital signs: Vitals:   06/02/16 0900 06/02/16 1000  BP: (!) 109/59 106/64  Pulse: 68 69  Resp: 19 14  Temp:    T 97.7  Last BM Date: 06/01/16 General:   Lethargic, thin, no acute distress HEENT: anicteric sclera, poor dentition Neck: supple, nontender Lungs:  Clear throughout to auscultation.   No wheezes, crackles, or rhonchi. No acute distress. Heart:  Regular rate and rhythm; no murmurs, clicks, rubs,  or gallops. Abdomen: soft, nontender, nondistended, +BS Rectal:  Deferred Ext: no edema  GI:  Lab Results:  Recent Labs  06/01/16 2200 06/02/16 0327 06/02/16 1102  WBC 8.7 8.7 8.0  HGB 9.8* 8.8* 9.5*  HCT 27.0* 25.2* 26.7*  PLT 306 207 182   BMET  Recent Labs  06/01/16 2200 06/02/16 0327  NA 137 138  K 3.7 4.5  CL 101 107  CO2 26 24  GLUCOSE 210* 119*  BUN 33* 28*  CREATININE 1.10 0.76  CALCIUM 8.5* 7.4*   LFT  Recent Labs  06/01/16 2200  PROT 5.8*  ALBUMIN 3.1*  AST 18  ALT 9*  ALKPHOS 51  BILITOT 0.5   PT/INR  Recent Labs  06/02/16 0053  LABPROT 14.0  INR 1.07  Studies/Results: Dg Abd Acute W/chest  Result Date: 06/02/2016 CLINICAL DATA:  Patient is been vomiting for over 1 week. Seen at [facility] a couple of days ago and did not find anything wrong. Now began vomiting blood today. Increasing weakness. Mid abdominal pain. Patient is unstable and receiving blood transfusions. EXAM: DG ABDOMEN ACUTE W/ 1V CHEST COMPARISON:  CT abdomen pelvis 05/29/2016.  Chest 05/29/2016. FINDINGS: Normal heart size and pulmonary vascularity. No focal airspace disease or consolidation in the lungs. No blunting of costophrenic angles. No pneumothorax. Mediastinal contours appear intact. Scattered gas and stool in the colon. No small or large bowel distention. No free  intra-abdominal air. No abnormal air-fluid levels. No radiopaque stones. Visualized bones appear intact. IMPRESSION: No evidence of active pulmonary disease. Normal nonobstructive bowel gas pattern. No free air. Electronically Signed   By: Burman Nieves M.D.   On: 06/02/2016 01:00    Impression/Plan: 51 yo with melena and hematemesis concerning for a peptic ulcer bleed. Mallory Weiss tear also possible. Gastritis less likely to be source of bleeding. Doubt variceal bleed. Bedside EGD. Supportive care. Protonix drip. Octreotide drip.    LOS: 0 days   Ikhlas Albo C.  06/02/2016, 11:36 AM  Pager [phone]  AFTER 5 pm or on weekends please call [phone]

## 2016-06-03 DIAGNOSIS — F1099 Alcohol use, unspecified with unspecified alcohol-induced disorder: Secondary | ICD-10-CM

## 2016-06-03 DIAGNOSIS — D62 Acute posthemorrhagic anemia: Secondary | ICD-10-CM

## 2016-06-03 DIAGNOSIS — K219 Gastro-esophageal reflux disease without esophagitis: Secondary | ICD-10-CM

## 2016-06-03 DIAGNOSIS — R739 Hyperglycemia, unspecified: Secondary | ICD-10-CM

## 2016-06-03 DIAGNOSIS — K254 Chronic or unspecified gastric ulcer with hemorrhage: Principal | ICD-10-CM

## 2016-06-03 DIAGNOSIS — IMO0002 Reserved for concepts with insufficient information to code with codable children: Secondary | ICD-10-CM

## 2016-06-03 LAB — BASIC METABOLIC PANEL
ANION GAP: 4 — AB (ref 5–15)
BUN: 13 mg/dL (ref 6–20)
CHLORIDE: 110 mmol/L (ref 101–111)
CO2: 25 mmol/L (ref 22–32)
Calcium: 7.6 mg/dL — ABNORMAL LOW (ref 8.9–10.3)
Creatinine, Ser: 0.89 mg/dL (ref 0.61–1.24)
GFR calc non Af Amer: >60 mL/min (ref >60)
Glucose, Bld: 90 mg/dL (ref 65–99)
POTASSIUM: 3.8 mmol/L (ref 3.5–5.1)
SODIUM: 139 mmol/L (ref 135–145)

## 2016-06-03 LAB — CBC
HEMATOCRIT: 24.4 % — AB (ref 39.0–52.0)
Hemoglobin: 8.6 g/dL — ABNORMAL LOW (ref 13.0–17.0)
MCH: 34 pg (ref 26.0–34.0)
MCHC: 35.2 g/dL (ref 30.0–36.0)
MCV: 96.4 fL (ref 78.0–100.0)
Platelets: 171 10*3/uL (ref 150–400)
RBC: 2.53 MIL/uL — AB (ref 4.22–5.81)
RDW: 13.8 % (ref 11.5–15.5)
WBC: 7.4 10*3/uL (ref 4.0–10.5)

## 2016-06-03 LAB — HIV ANTIBODY (ROUTINE TESTING W REFLEX): HIV Screen 4th Generation wRfx: NONREACTIVE

## 2016-06-03 LAB — VITAMIN B12: Vitamin B-12: 173 pg/mL — ABNORMAL LOW (ref 180–914)

## 2016-06-03 LAB — MAGNESIUM: MAGNESIUM: 1.8 mg/dL (ref 1.7–2.4)

## 2016-06-03 MED ORDER — PANTOPRAZOLE SODIUM 40 MG IV SOLR
40.0000 mg | Freq: Two times a day (BID) | INTRAVENOUS | Status: DC
  Administered 2016-06-03 – 2016-06-04 (×3): 40 mg via INTRAVENOUS
  Filled 2016-06-03 (×3): qty 40

## 2016-06-03 MED ORDER — SUCRALFATE 1 GM/10ML PO SUSP
1.0000 g | Freq: Three times a day (TID) | ORAL | Status: DC
  Administered 2016-06-03 – 2016-06-04 (×4): 1 g via ORAL
  Filled 2016-06-03 (×4): qty 10

## 2016-06-03 NOTE — Progress Notes (Signed)
[facility] PROGRESS NOTE  Derek Fowler WGN:[id number] DOB: 12/26/1965 DOA: 06/01/2016 PCP: No PCP Per Patient  Interim summary and HPI 51 y/o male with PMH significant for alcohol abuse, tobacco abuse and intermittent use of NSAIDs. Who presented to ED secondary to hematemesis and hematochezia. Before bleeding episodes, patient reported approx 2 weeks of epigastric discomfort. Found mildly hypotensive initially and with decreased Hgb from 16 to 9.8.  Patient had EGD demonstrating deep catered gastric ulcer and surrounding edematous mucosa. initially follow by PCCM, but given stability now, care transferred to [facility].  Assessment/Plan: 1-gastrointestinal bleed: due to gastric ulcer -no further signs of bleeding currently -Hgb remains stab;e -patient received 36 hours of protonix drip and Octreotide drip -given stability will advance diet to Clear Liquids and will stop protonic and octreotide drip -patient to be started on protonic IV q12h for 24 hours. -will transfer to telemetry bed -continue monitoring Hgb trend and transfused as needed   2-acute blood loss anemia -received 2 units of PRBC's -will monitor Hgb trend and will transfuse as needed -hemodynamically stable currently  3-mild hypotension: in setting of acute bleeding -BP is now stable and WNL -will monitor closely  4-alcohol abuse and use of NSAID's -cessation counseling provided; patient endorses complete abstinence for over a month now. -encourage to maintain absolute abstinence -tylenol PRN for pain  5-tobacco abuse -cessation counseling provided -patient has declined nicotine patch  6-hyperglycemia -no prior hx of diabetes -will repeat fastin in am and will also check A1C  Code Status: Full Family Communication: no family at bedside  Disposition Plan: will transition to IV Protonix q12h for another 24 hours; stop octreotide and advance diet to CLD. Will move to telemetry bed. Follow Hgb trend and follow  any further GI recommendations. Biopsy of surrounding edematous changes taken and pending currently.    Consultants:  PCCM  GI  Procedures:  EGD 06/02/16: positive deep cratered gastric ulcer, with adherent clot and edematous mucosa surrounding ulcer.  Antibiotics:  None   HPI/Subjective: Afebrile, in no acute distress. Patient CP and SOB. No further episodes of bleeding appreciated.  Objective: Vitals:   06/03/16 0800 06/03/16 0900  BP: 134/72 (!) 143/68  Pulse: 63 65  Resp: 12 12  Temp: 98.2 F (36.8 C)     Intake/Output Summary (Last 24 hours) at 06/03/16 1014 Last data filed at 06/03/16 0900  Gross per 24 hour  Intake             2300 ml  Output             1600 ml  Net              700 ml   Filed Weights   06/02/16 0230 06/02/16 0237 06/03/16 0500  Weight: 60.4 kg (133 lb 2.5 oz) 60.4 kg (133 lb 2.5 oz) 60.1 kg (132 lb 7.9 oz)    Exam:   General: afebrile, no CP and no SOB. Patient denies nausea and further vomiting. Patient also reported no further active bleeding appreciated.  Cardiovascular: S1 and S2, no murmurs, no rubs  Respiratory: CTA bilaterally  Abdomen: soft, ND, positive BS, no guarding   Musculoskeletal: no edema or cyanosis   Data Reviewed: Basic Metabolic Panel:  Recent Labs Lab 06/01/16 2200 06/02/16 0327 06/03/16 0359  NA 137 138 139  K 3.7 4.5 3.8  CL 101 107 110  CO2 GLUCOSE 210* 119* 90  BUN 33* 28* 13  CREATININE 1.10 0.76 0.89  CALCIUM 8.5* 7.4* 7.6*  MG  --  1.8 1.8  PHOS  --  3.5  --    Liver Function Tests:  Recent Labs Lab 06/01/16 2200  AST 18  ALT 9*  ALKPHOS 51  BILITOT 0.5  PROT 5.8*  ALBUMIN 3.1*   CBC:  Recent Labs Lab 06/02/16 0327 06/02/16 1102 06/02/16 1654 06/02/16 2232 06/03/16 0359  WBC 8.7 8.0 6.7 7.0 7.4  HGB 8.8* 9.5* 8.7* 8.4* 8.6*  HCT 25.2* 26.7* 25.1* 24.0* 24.4*  MCV 94.0 95.4 93.7 94.1 96.4  PLT 207 182 167 169 171    CBG: No results for input(s): GLUCAP  in the last 168 hours.  Recent Results (from the past 240 hour(s))  MRSA PCR Screening     Status: None   Collection Time: 06/02/16  3:18 AM  Result Value Ref Range Status   MRSA by PCR NEGATIVE NEGATIVE Final    Comment:        The GeneXpert MRSA Assay (FDA approved for NASAL specimens only), is one component of a comprehensive MRSA colonization surveillance program. It is not intended to diagnose MRSA infection nor to guide or monitor treatment for MRSA infections.      Studies: Dg Abd Acute W/chest  Result Date: 06/02/2016 CLINICAL DATA:  Patient is been vomiting for over 1 week. Seen at [facility] a couple of days ago and did not find anything wrong. Now began vomiting blood today. Increasing weakness. Mid abdominal pain. Patient is unstable and receiving blood transfusions. EXAM: DG ABDOMEN ACUTE W/ 1V CHEST COMPARISON:  CT abdomen pelvis 05/29/2016.  Chest 05/29/2016. FINDINGS: Normal heart size and pulmonary vascularity. No focal airspace disease or consolidation in the lungs. No blunting of costophrenic angles. No pneumothorax. Mediastinal contours appear intact. Scattered gas and stool in the colon. No small or large bowel distention. No free intra-abdominal air. No abnormal air-fluid levels. No radiopaque stones. Visualized bones appear intact. IMPRESSION: No evidence of active pulmonary disease. Normal nonobstructive bowel gas pattern. No free air. Electronically Signed   By: Burman Nieves M.D.   On: 06/02/2016 01:00    Scheduled Meds: . pantoprazole  40 mg Intravenous Q12H   Continuous Infusions: . sodium chloride 50 mL/hr at 06/03/16 4098    Active Problems:   GI bleed   Gastroesophageal reflux disease   Acute blood loss anemia   Hyperglycemia   Alcohol use disorder (HCC)    Time spent: 25 minutes    Vassie Loll  [facility] Pager [phone]. If 7PM-7AM, please contact night-coverage at [email or web address], password [facility] 06/03/2016, 10:14 AM   LOS: 1 day

## 2016-06-03 NOTE — Progress Notes (Signed)
No bleeding, no BM's, no hematemesis, Hgb stable, BUN nl.  Path and H pylori serology pending.  Recomm:  1. Have added sucralfate susp as adjunctive therapy to help "patch" the ulcer 2. Agree w/ Clr liq diet and IV Protonix for now 3. If stable in a.m., ok to advance to regular diet  Florencia Reasons, M.D. Pager [phone] If no answer or after 5 PM call [phone]

## 2016-06-04 DIAGNOSIS — E876 Hypokalemia: Secondary | ICD-10-CM

## 2016-06-04 LAB — CBC
HEMATOCRIT: 25.6 % — AB (ref 39.0–52.0)
HEMOGLOBIN: 9 g/dL — AB (ref 13.0–17.0)
MCH: 32.6 pg (ref 26.0–34.0)
MCHC: 35.2 g/dL (ref 30.0–36.0)
MCV: 92.8 fL (ref 78.0–100.0)
Platelets: 203 10*3/uL (ref 150–400)
RBC: 2.76 MIL/uL — AB (ref 4.22–5.81)
RDW: 13.4 % (ref 11.5–15.5)
WBC: 7.1 10*3/uL (ref 4.0–10.5)

## 2016-06-04 LAB — BASIC METABOLIC PANEL
ANION GAP: 7 (ref 5–15)
BUN: 6 mg/dL (ref 6–20)
CHLORIDE: 105 mmol/L (ref 101–111)
CO2: 25 mmol/L (ref 22–32)
Calcium: 8.2 mg/dL — ABNORMAL LOW (ref 8.9–10.3)
Creatinine, Ser: 0.77 mg/dL (ref 0.61–1.24)
GFR calc non Af Amer: >60 mL/min (ref >60)
GLUCOSE: 98 mg/dL (ref 65–99)
POTASSIUM: 3.4 mmol/L — AB (ref 3.5–5.1)
Sodium: 137 mmol/L (ref 135–145)

## 2016-06-04 MED ORDER — SUCRALFATE 1 GM/10ML PO SUSP: 1.0000 g | Freq: Three times a day (TID) | ORAL | 0 refills | Status: AC

## 2016-06-04 MED ORDER — VITAMIN B-12 1000 MCG PO TABS: 1000.0000 ug | ORAL_TABLET | Freq: Every day | ORAL | 3 refills | Status: AC

## 2016-06-04 MED ORDER — PANTOPRAZOLE SODIUM 40 MG PO TBEC: 40.0000 mg | DELAYED_RELEASE_TABLET | Freq: Two times a day (BID) | ORAL | 1 refills | Status: AC

## 2016-06-04 MED ORDER — POTASSIUM CHLORIDE 10 MEQ/100ML IV SOLN
10.0000 meq | INTRAVENOUS | Status: AC
  Administered 2016-06-04 (×3): 10 meq via INTRAVENOUS
  Filled 2016-06-04 (×3): qty 100

## 2016-06-04 MED ORDER — SODIUM CHLORIDE 0.9 % IV SOLN: 30.0000 meq | Freq: Once | INTRAVENOUS | Status: DC

## 2016-06-04 NOTE — Progress Notes (Signed)
Hemoglobin stable, actually slightly improved. BUN normal. Patient feels well. Tolerating solid diet.  Helicobacter pylori serology and gastric biopsy results still pending.  Impression: Quiescent GI bleeding from severe gastric ulcer with improving acute posthemorrhagic anemia  Plan:  1. Okay for discharge at any time from GI tract standpoint 2. Would discharge patient on twice daily PPI for at least the first week, after which it could potentially be cut back to once daily dosing 3. No need for sucralfate following discharge 4. Importance of aspirin and nonsteroidal anti-inflammatory drug avoidance emphasized 5. I gave the patient a card for our office to call for an appointment, and emphasized the importance of follow-up in the next one to 2 weeks with Dr. Charlott Rakes to recheck hemoglobin, check Hemoccult status, determine optimal PPI dose, consider initiation of iron supplementation, and arrange follow-up endoscopy to confirm ulcer healing at Dr. Marge Duncans discretion.  6. We will sign off. Please call us if we can be of further assistance with this patient.  Florencia Reasons, M.D. Pager [phone] If no answer or after 5 PM call [phone]

## 2016-06-04 NOTE — Discharge Summary (Signed)
Physician Discharge Summary  Derek Fowler:[id number] DOB: [birth year] DOA: 06/01/2016  PCP: No PCP Per Patient  Admit date: 06/01/2016 Discharge date: 06/04/2016  Time spent: 35 minutes  Recommendations for Outpatient Follow-up:  1. Repeat BMET to follow electrolytes 2. Repeat CBC to follow Hgb trend  3. Follow up final A1C results and reassess BP.   Discharge Diagnoses:  Active Problems:   GI bleed   Gastroesophageal reflux disease   Acute blood loss anemia   Hyperglycemia   Alcohol use disorder (HCC)   Hypokalemia B12 deficiency   Discharge Condition: stable and improved. Discharge home with instructions to follow up with GI service as an outpatient and to establish care with a PCP.  Diet recommendation: regular diet   Filed Weights   06/03/16 0500 06/04/16 0531 06/04/16 1356  Weight: 60.1 kg (132 lb 7.9 oz) 64.9 kg (143 lb 1.3 oz) 65.6 kg (144 lb 10 oz)    History of present illness:  51 y/o male with PMH significant for alcohol abuse, tobacco abuse and intermittent use of NSAIDs. Who presented to ED secondary to hematemesis and hematochezia. Before bleeding episodes, patient reported approx 2 weeks of epigastric discomfort. Found mildly hypotensive initially and with decreased Hgb from 16 to 9.8.  Patient had EGD demonstrating deep catered gastric ulcer and surrounding edematous mucosa. initially follow by PCCM, but given stability now, care transferred to [facility].  Hospital Course:  1-gastrointestinal bleed: due to gastric ulcer -Hgb trending up and no further bleeding appreciated -patient received 36 hours of protonix drip and Octreotide drip; followed by 24 hours of IV protonix BID -patient to continue protonix BID and per patient request (as he expressed it makes his stomach feels good) will also discharge on carafate -will discharge home; close follow up with GI service as an outpatient  -CBC at follow up to assess Hgb trend   -advise to avoid NSAID's, smoking  cessation and complete alcohol abstinence   2-acute blood loss anemia -received 2 units of PRBC's -hemodynamically stable currently -Hgb trending up; 9.0 at discharge -will recommend repeat CBC to follow Hgb trend   3-mild hypotension: in setting of acute bleeding -BP is now stable and WNL -will recommend reassessing BP at follow up  4-alcohol abuse and use of NSAID's -cessation counseling provided; patient endorses complete abstinence for over a month now. -encourage to maintain absolute abstinence -tylenol PRN for pain  5-tobacco abuse -cessation counseling provided -patient has declined nicotine patch  6-hyperglycemia -no prior hx of diabetes -will follow A1C (pending at discharge)  7-B12 deficiency  -will discharge on B12 1000 mcg daily  8-hypokalemia -repleted -recommend BMET to follow electrolytes and follow up visit   Procedures:  EGD 06/02/16: positive deep cratered gastric ulcer, with adherent clot and edematous mucosa surrounding ulcer.  Consultations:  PCCM  GI  Discharge Exam: Vitals:   06/04/16 0531 06/04/16 1356  BP: 115/70 122/65  Pulse: (!) 58 70  Resp: 16 16  Temp: 97.9 F (36.6 C) 98.2 F (36.8 C)    General: afebrile, no CP and no SOB. Patient denies nausea or further vomiting. No further bleeding appreciated.  Cardiovascular: S1 and S2, no murmurs, no rubs  Respiratory: CTA bilaterally  Abdomen: soft, ND, positive BS, no guarding   Musculoskeletal: no edema or cyanosis    Discharge Instructions   Discharge Instructions    Discharge instructions    Complete by:  As directed    Keep yourself well hydrated  No alcohol No smoking Follow up  with GI service as instructed  Please avoid the use of NSAIDs (ibuprofen, aleve, naproxen, motrin, goody powder, Excedrin, Aspirin, Etc...)     Current Discharge Medication List    START taking these medications   Details  pantoprazole (PROTONIX) 40 MG tablet Take 1 tablet (40 mg  total) by mouth 2 (two) times daily. Qty: 60 tablet, Refills: 1    sucralfate (CARAFATE) 1 GM/10ML suspension Take 10 mLs (1 g total) by mouth 4 (four) times daily -  with meals and at bedtime. Qty: 420 mL, Refills: 0    vitamin B-12 (CYANOCOBALAMIN) 1000 MCG tablet Take 1 tablet (1,000 mcg total) by mouth daily. Qty: 30 tablet, Refills: 3       No Known Allergies Follow-up Information    SCHOOLER,VINCENT C., MD. Call today.   Specialty:  Gastroenterology Why:  office for appointment details  Contact information: [address] [place] [address] [phone]            The results of significant diagnostics from this hospitalization (including imaging, microbiology, ancillary and laboratory) are listed below for reference.    Significant Diagnostic Studies: Dg Abd Acute W/chest  Result Date: 06/02/2016 CLINICAL DATA:  Patient is been vomiting for over 1 week. Seen at [facility] a couple of days ago and did not find anything wrong. Now began vomiting blood today. Increasing weakness. Mid abdominal pain. Patient is unstable and receiving blood transfusions. EXAM: DG ABDOMEN ACUTE W/ 1V CHEST COMPARISON:  CT abdomen pelvis 05/29/2016.  Chest 05/29/2016. FINDINGS: Normal heart size and pulmonary vascularity. No focal airspace disease or consolidation in the lungs. No blunting of costophrenic angles. No pneumothorax. Mediastinal contours appear intact. Scattered gas and stool in the colon. No small or large bowel distention. No free intra-abdominal air. No abnormal air-fluid levels. No radiopaque stones. Visualized bones appear intact. IMPRESSION: No evidence of active pulmonary disease. Normal nonobstructive bowel gas pattern. No free air. Electronically Signed   By: Burman Nieves M.D.   On: 06/02/2016 01:00    Microbiology: Recent Results (from the past 240 hour(s))  MRSA PCR Screening     Status: None   Collection Time: 06/02/16  3:18 AM  Result  Value Ref Range Status   MRSA by PCR NEGATIVE NEGATIVE Final    Comment:        The GeneXpert MRSA Assay (FDA approved for NASAL specimens only), is one component of a comprehensive MRSA colonization surveillance program. It is not intended to diagnose MRSA infection nor to guide or monitor treatment for MRSA infections.      Labs: Basic Metabolic Panel:  Recent Labs Lab 06/01/16 2200 06/02/16 0327 06/03/16 0359 06/04/16 0553  NA 137 138 139 137  K 3.7 4.5 3.8 3.4*  CL 101 107 110 105  CO2 GLUCOSE 210* 119* 90 98  BUN 33* 28* 13 6  CREATININE 1.10 0.76 0.89 0.77  CALCIUM 8.5* 7.4* 7.6* 8.2*  MG  --  1.8 1.8  --   PHOS  --  3.5  --   --    Liver Function Tests:  Recent Labs Lab 06/01/16 2200  AST 18  ALT 9*  ALKPHOS 51  BILITOT 0.5  PROT 5.8*  ALBUMIN 3.1*   CBC:  Recent Labs Lab 06/02/16 1102 06/02/16 1654 06/02/16 2232 06/03/16 0359 06/04/16 0553  WBC 8.0 6.7 7.0 7.4 7.1  HGB 9.5* 8.7* 8.4* 8.6* 9.0*  HCT 26.7* 25.1* 24.0* 24.4* 25.6*  MCV 95.4 93.7 94.1 96.4 92.8  PLT 182 167 169 171 203    Signed:  Vassie Loll MD.  [facility] 06/04/2016, 3:48 PM

## 2016-06-05 ENCOUNTER — Encounter (HOSPITAL_COMMUNITY): Payer: Self-pay | Admitting: Gastroenterology

## 2016-06-05 LAB — TYPE AND SCREEN
ABO/RH(D): O POS
ANTIBODY SCREEN: NEGATIVE
UNIT DIVISION: 0
UNIT DIVISION: 0
Unit division: 0
Unit division: 0

## 2016-06-05 LAB — H. PYLORI ANTIBODY, IGG: H PYLORI IGG: 6.92 {index_val} — AB (ref 0.00–0.79)

## 2016-06-05 LAB — BPAM RBC
BLOOD PRODUCT EXPIRATION DATE: 201805152359
BLOOD PRODUCT EXPIRATION DATE: 201805152359
Blood Product Expiration Date: 201805152359
Blood Product Expiration Date: 201805152359
ISSUE DATE / TIME: 201804200324
ISSUE DATE / TIME: 201804200023
ISSUE DATE / TIME: 201804220834
ISSUE DATE / TIME: 201804220834
UNIT TYPE AND RH: 5100
Unit Type and Rh: 5100
Unit Type and Rh: 5100
Unit Type and Rh: 5100

## 2016-06-05 LAB — HEMOGLOBIN A1C
HEMOGLOBIN A1C: 5.3 % (ref 4.8–5.6)
MEAN PLASMA GLUCOSE: 105 mg/dL

## 2017-12-27 IMAGING — DX DG ABDOMEN ACUTE W/ 1V CHEST
3 series · 5 of 5 positions shown · non-contrast
Comparison: CT abdomen pelvis 05/29/2016.  Chest 05/29/2016.

CLINICAL DATA: Patient is been vomiting for over 1 week. Seen at
[facility] a couple of days ago and did not find anything
wrong. Now began vomiting blood today. Increasing weakness. Mid
abdominal pain. Patient is unstable and receiving blood
transfusions.

EXAM:
DG ABDOMEN ACUTE W/ 1V CHEST

[Series 1: chest pa · 0.14mm/px · 2 of 2 slices shown]
[im 1/2]
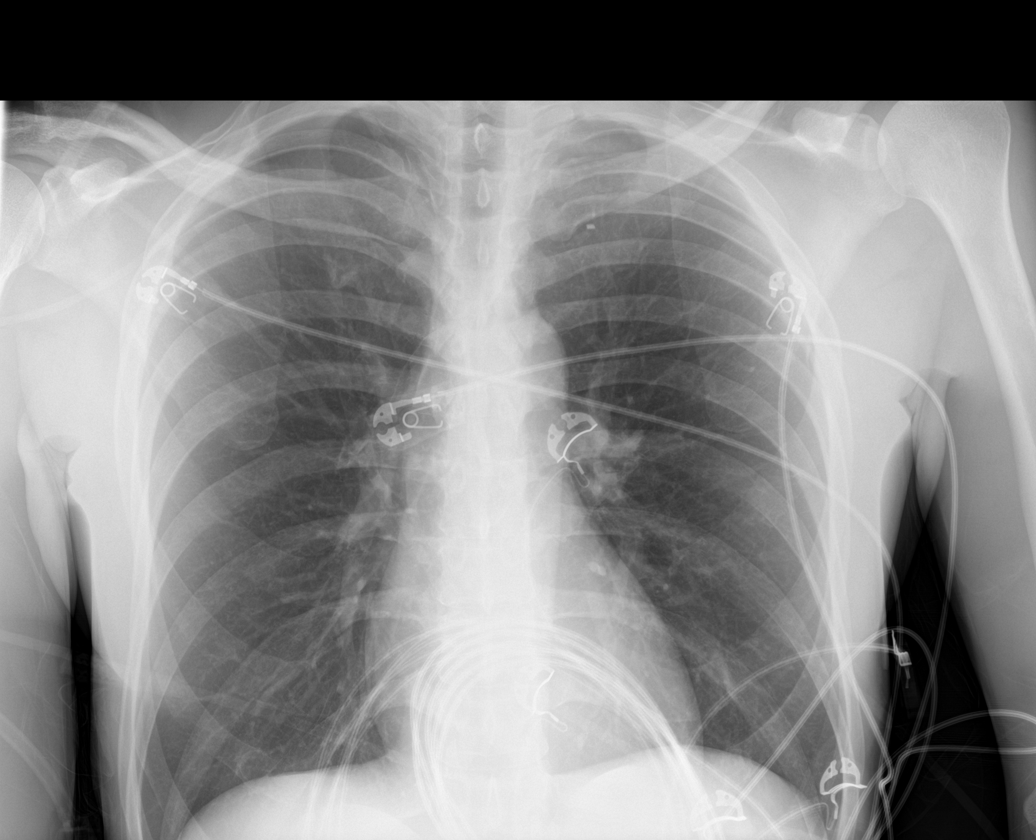
[im 2/2]
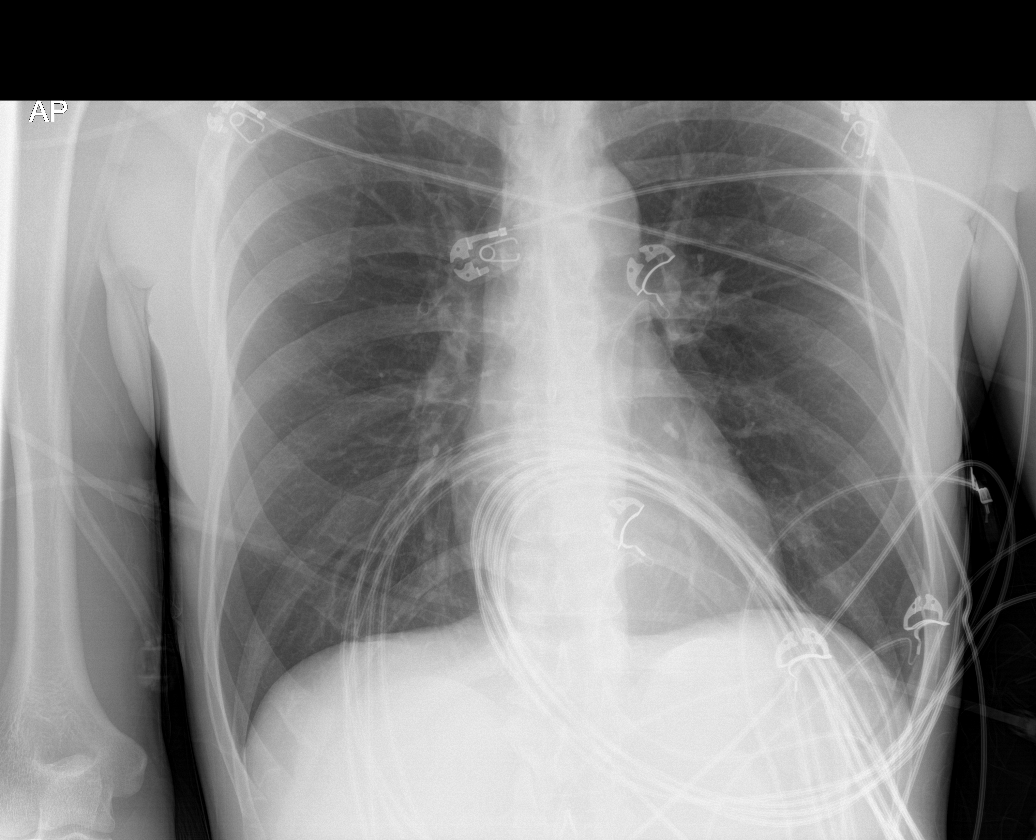

[Series 2: abdomen erect · 0.14mm/px · 2 of 2 slices shown]
[im 1/2]
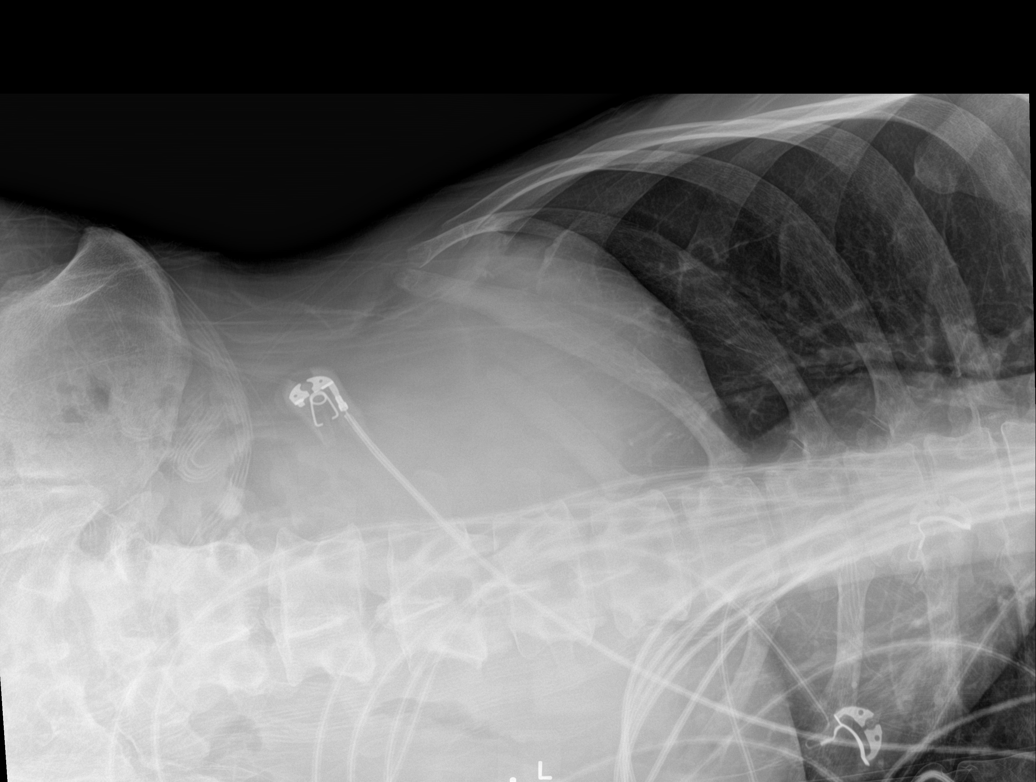
[im 2/2]
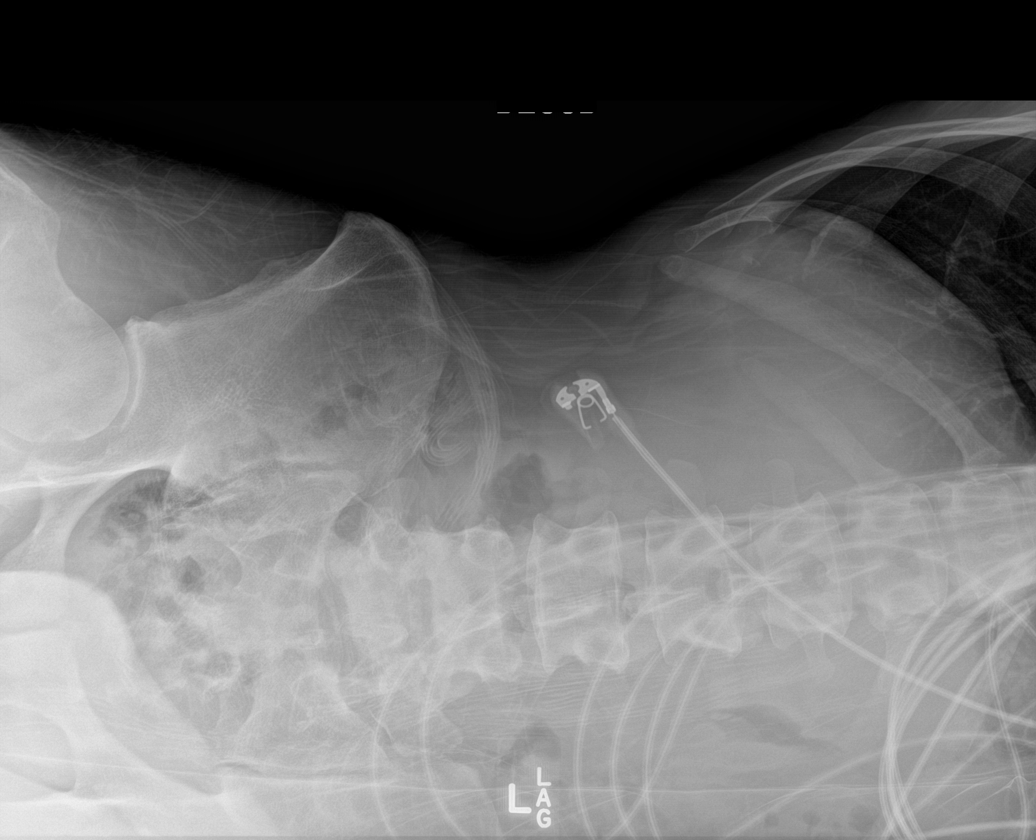

[abdomen supine]
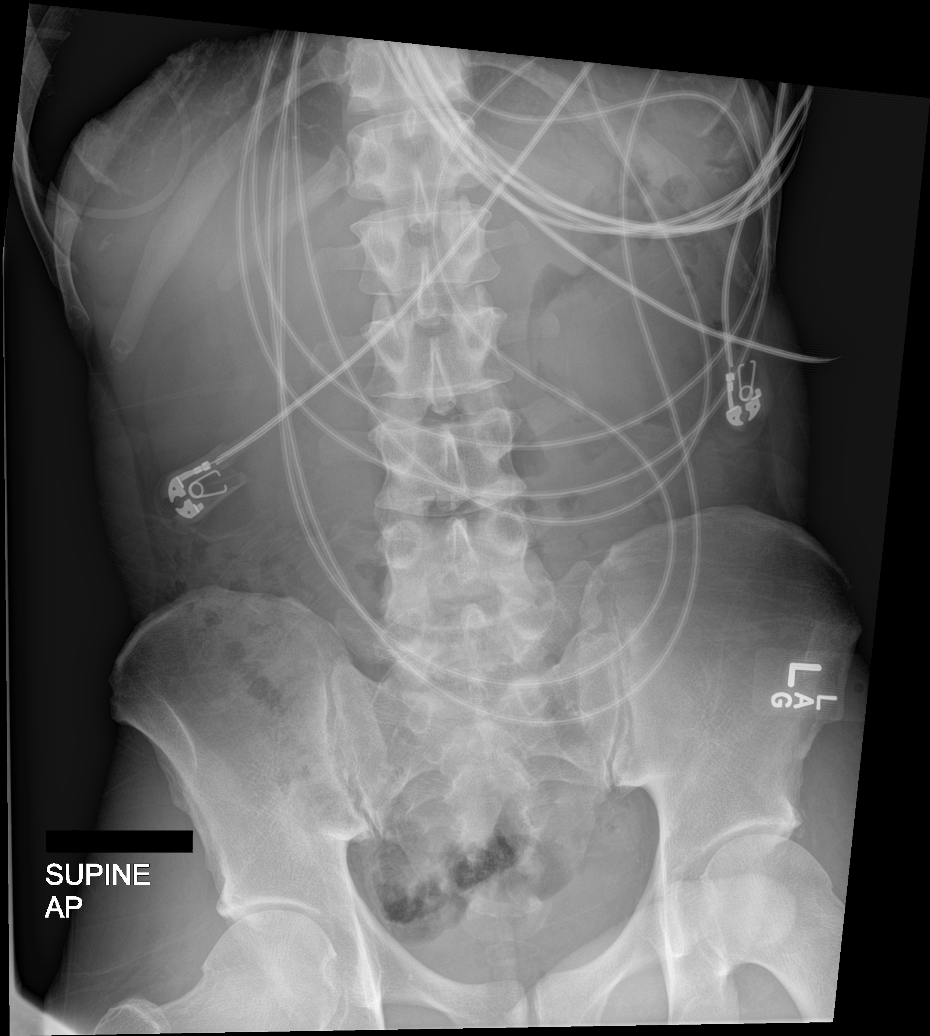

[5 of 5 positions shown; findings below may reference images not displayed]

FINDINGS: Normal heart size and pulmonary vascularity. No focal airspace
disease or consolidation in the lungs. No blunting of costophrenic
angles. No pneumothorax. Mediastinal contours appear intact.

Scattered gas and stool in the colon. No small or large bowel
distention. No free intra-abdominal air. No abnormal air-fluid
levels. No radiopaque stones. Visualized bones appear intact.
IMPRESSION: No evidence of active pulmonary disease. Normal nonobstructive bowel
gas pattern. No free air.

## 2020-04-13 DEATH — deceased
